# Patient Record
Sex: Female | Born: 2000 | Race: Black or African American | Hispanic: No | Marital: Single | Smoking: Never smoker
Health system: Southern US, Community
[De-identification: ages and names within clinical notes are randomized; demographics above are authoritative.]

## PROBLEM LIST (undated history)

## (undated) DIAGNOSIS — K648 Other hemorrhoids: Secondary | ICD-10-CM

## (undated) DIAGNOSIS — Z68.41 Body mass index (BMI) pediatric, greater than or equal to 95th percentile for age: Secondary | ICD-10-CM

## (undated) DIAGNOSIS — IMO0001 Reserved for inherently not codable concepts without codable children: Secondary | ICD-10-CM

## (undated) DIAGNOSIS — K5909 Other constipation: Secondary | ICD-10-CM

## (undated) DIAGNOSIS — R51 Headache: Secondary | ICD-10-CM

## (undated) DIAGNOSIS — H6690 Otitis media, unspecified, unspecified ear: Secondary | ICD-10-CM

## (undated) DIAGNOSIS — E669 Obesity, unspecified: Secondary | ICD-10-CM

## (undated) DIAGNOSIS — J40 Bronchitis, not specified as acute or chronic: Secondary | ICD-10-CM

## (undated) DIAGNOSIS — M25561 Pain in right knee: Secondary | ICD-10-CM

## (undated) DIAGNOSIS — IMO0002 Reserved for concepts with insufficient information to code with codable children: Secondary | ICD-10-CM

## (undated) DIAGNOSIS — R519 Headache, unspecified: Secondary | ICD-10-CM

## (undated) HISTORY — DX: Bronchitis, not specified as acute or chronic: J40

## (undated) HISTORY — DX: Other constipation: K59.09

## (undated) HISTORY — DX: Reserved for concepts with insufficient information to code with codable children: IMO0002

## (undated) HISTORY — DX: Obesity, unspecified: E66.9

## (undated) HISTORY — DX: Otitis media, unspecified, unspecified ear: H66.90

## (undated) HISTORY — DX: Headache: R51

## (undated) HISTORY — PX: NO PAST SURGERIES: SHX2092

## (undated) HISTORY — DX: Body mass index (bmi) pediatric, greater than or equal to 95th percentile for age: Z68.54

## (undated) HISTORY — DX: Reserved for inherently not codable concepts without codable children: IMO0001

## (undated) HISTORY — DX: Headache, unspecified: R51.9

## (undated) HISTORY — DX: Pain in right knee: M25.561

## (undated) HISTORY — DX: Other hemorrhoids: K64.8

---
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
## 2018-05-06 ENCOUNTER — Other Ambulatory Visit: Payer: Self-pay

## 2018-05-06 ENCOUNTER — Emergency Department (HOSPITAL_COMMUNITY): Payer: Medicaid Other

## 2018-05-06 ENCOUNTER — Emergency Department (HOSPITAL_COMMUNITY)
Admission: EM | Admit: 2018-05-06 | Discharge: 2018-05-06 | Disposition: A | Discharge status: Home / Self Care | Payer: Medicaid Other | Attending: Emergency Medicine | Admitting: Emergency Medicine

## 2018-05-06 DIAGNOSIS — R1013 Epigastric pain: Secondary | ICD-10-CM | POA: Diagnosis not present

## 2018-05-06 DIAGNOSIS — Z79899 Other long term (current) drug therapy: Secondary | ICD-10-CM | POA: Diagnosis not present

## 2018-05-06 LAB — I-STAT BETA HCG BLOOD, ED (MC, WL, AP ONLY): I-stat hCG, quantitative: <5 m[IU]/mL (ref <5)

## 2018-05-06 LAB — COMPREHENSIVE METABOLIC PANEL
ALT: 33 U/L (ref 0–44)
AST: 45 U/L — ABNORMAL HIGH (ref 15–41)
Albumin: 3.9 g/dL (ref 3.5–5.0)
Alkaline Phosphatase: 52 U/L (ref 38–126)
Anion gap: 11 (ref 5–15)
BUN: 11 mg/dL (ref 6–20)
CO2: 21 mmol/L — ABNORMAL LOW (ref 22–32)
Calcium: 9.6 mg/dL (ref 8.9–10.3)
Chloride: 107 mmol/L (ref 98–111)
Creatinine, Ser: 1 mg/dL (ref 0.44–1.00)
GFR calc Af Amer: >60 mL/min (ref >60)
GFR calc non Af Amer: >60 mL/min (ref >60)
Glucose, Bld: 123 mg/dL — ABNORMAL HIGH (ref 70–99)
Potassium: 3.7 mmol/L (ref 3.5–5.1)
Sodium: 139 mmol/L (ref 135–145)
Total Bilirubin: 0.5 mg/dL (ref 0.3–1.2)
Total Protein: 7.3 g/dL (ref 6.5–8.1)

## 2018-05-06 LAB — URINALYSIS, ROUTINE W REFLEX MICROSCOPIC
Bilirubin Urine: NEGATIVE
Glucose, UA: NEGATIVE mg/dL
Hgb urine dipstick: NEGATIVE
Ketones, ur: NEGATIVE mg/dL
LEUKOCYTE UA: NEGATIVE
NITRITE: NEGATIVE
Protein, ur: NEGATIVE mg/dL
Specific Gravity, Urine: >1.046 — ABNORMAL HIGH (ref 1.005–1.030)
pH: 6 (ref 5.0–8.0)

## 2018-05-06 LAB — CBC
HCT: 38.8 % (ref 36.0–46.0)
Hemoglobin: 12.5 g/dL (ref 12.0–15.0)
MCH: 30.1 pg (ref 26.0–34.0)
MCHC: 32.2 g/dL (ref 30.0–36.0)
MCV: 93.5 fL (ref 80.0–100.0)
Platelets: 363 10*3/uL (ref 150–400)
RBC: 4.15 MIL/uL (ref 3.87–5.11)
RDW: 12.3 % (ref 11.5–15.5)
WBC: 18.8 10*3/uL — AB (ref 4.0–10.5)
nRBC: 0 % (ref 0.0–0.2)

## 2018-05-06 LAB — LIPASE, BLOOD: LIPASE: 27 U/L (ref 11–51)

## 2018-05-06 MED ORDER — KETOROLAC TROMETHAMINE 30 MG/ML IJ SOLN
30.0000 mg | Freq: Once | INTRAMUSCULAR | Status: AC
  Administered 2018-05-06: 30 mg via INTRAVENOUS
  Filled 2018-05-06: qty 1

## 2018-05-06 MED ORDER — PANTOPRAZOLE SODIUM 20 MG PO TBEC: 20.0000 mg | DELAYED_RELEASE_TABLET | Freq: Every day | ORAL | 1 refills | Status: DC

## 2018-05-06 MED ORDER — SODIUM CHLORIDE 0.9% FLUSH
3.0000 mL | Freq: Once | INTRAVENOUS | Status: AC
  Administered 2018-05-06: 3 mL via INTRAVENOUS

## 2018-05-06 MED ORDER — SUCRALFATE 1 G PO TABS: 1.0000 g | ORAL_TABLET | Freq: Four times a day (QID) | ORAL | 0 refills | Status: AC

## 2018-05-06 MED ORDER — METOCLOPRAMIDE HCL 5 MG/ML IJ SOLN
10.0000 mg | INTRAMUSCULAR | Status: AC
  Administered 2018-05-06: 10 mg via INTRAVENOUS
  Filled 2018-05-06: qty 2

## 2018-05-06 MED ORDER — IOHEXOL 300 MG/ML  SOLN
100.0000 mL | Freq: Once | INTRAMUSCULAR | Status: AC | PRN
  Administered 2018-05-06: 100 mL via INTRAVENOUS

## 2018-05-06 MED ORDER — ONDANSETRON HCL 4 MG/2ML IJ SOLN
4.0000 mg | Freq: Once | INTRAMUSCULAR | Status: AC | PRN
  Administered 2018-05-06: 4 mg via INTRAVENOUS
  Filled 2018-05-06: qty 2

## 2018-05-06 MED ORDER — PROMETHAZINE HCL 25 MG PO TABS: 25.0000 mg | ORAL_TABLET | Freq: Four times a day (QID) | ORAL | 0 refills | Status: AC | PRN

## 2018-05-06 MED ORDER — PANTOPRAZOLE SODIUM 40 MG IV SOLR
40.0000 mg | Freq: Once | INTRAVENOUS | Status: AC
  Administered 2018-05-06: 40 mg via INTRAVENOUS
  Filled 2018-05-06: qty 40

## 2018-05-06 MED ORDER — SODIUM CHLORIDE 0.9 % IV BOLUS
1000.0000 mL | Freq: Once | INTRAVENOUS | Status: AC
  Administered 2018-05-06: 1000 mL via INTRAVENOUS

## 2018-05-06 MED ORDER — MORPHINE SULFATE (PF) 4 MG/ML IV SOLN
4.0000 mg | Freq: Once | INTRAVENOUS | Status: AC
  Administered 2018-05-06: 4 mg via INTRAVENOUS
  Filled 2018-05-06: qty 1

## 2018-05-06 NOTE — ED Notes (Addendum)
Patient transported to CT 

## 2018-05-06 NOTE — ED Notes (Signed)
Patient given ginger ale to drink 

## 2018-05-06 NOTE — ED Provider Notes (Signed)
[facility]   CSN: [id number] Arrival date & time: 05/06/18  0139    History   Chief Complaint Chief Complaint  Patient presents with  . Abdominal Pain    HPI Kaitlyn Blackburn is a 18 y.o. female.    18 year old female presents to the emergency department for evaluation of abdominal pain.  She has had similar pain in the past, most recently in July 2019.  Was evaluated at [facility] with negative ultrasound.  States that pain began 2 to 3 days ago.  It has been constant and gradually worsening, primarily in her epigastrium and nonradiating.  Patient took over-the-counter pain medication on day 1 of discomfort.  Has not taken any additional medicines.  She has had nausea with vomiting today.  Reports 3 episodes of nonbloody emesis.  Had a normal bowel movement earlier in the morning.  Denies history of abdominal surgeries.  Last menstrual period was 1 week ago.  The history is provided by the patient. No language interpreter was used.  Abdominal Pain    Past Medical History:  Diagnosis Date  . Bronchitis    patient was hospitalized for this  . Childhood obesity, BMI 95-100 percentile   . Chronic constipation   . Frontal headache   . Internal hemorrhoids without complication   . Knee pain, right   . Menarche   . Otitis media    patient was hospitalized for this    Patient Active Problem List   Diagnosis Date Noted  . Dysmenorrhea in adolescent 12/11/2014  . Pitted nails 10/12/2014  . Childhood obesity 10/12/2014  . Chronic constipation 10/12/2014  . Frontal headache 10/12/2014  . Internal hemorrhoids 10/12/2014  . Tinea versicolor 10/12/2014    Past Surgical History:  Procedure Laterality Date  . NO PAST SURGERIES       OB History   No obstetric history on file.      Home Medications    Prior to Admission medications   Medication Sig Start Date End Date Taking? Authorizing Provider    norgestimate-ethinyl estradiol (ORTHO-CYCLEN, 28,) 0.25-35 MG-MCG tablet Take 1 tablet by mouth daily. 12/13/17 12/13/18 Yes Pollak, Lavella HammockAdriana M, PA-C  pantoprazole (PROTONIX) 20 MG tablet Take 1 tablet (20 mg total) by mouth daily. 05/06/18   Antony MaduraHumes, Zeva Leber, PA-C  promethazine (PHENERGAN) 25 MG tablet Take 1 tablet (25 mg total) by mouth every 6 (six) hours as needed for nausea or vomiting. 05/06/18   Antony MaduraHumes, Efosa Treichler, PA-C  sucralfate (CARAFATE) 1 g tablet Take 1 tablet (1 g total) by mouth 4 (four) times daily. 05/06/18   Antony MaduraHumes, Maxwell Lemen, PA-C    Family History Family History  Problem Relation Age of Onset  . Healthy Mother   . Cancer Father        liver  . Diabetes Maternal Aunt   . Hypertension Maternal Grandmother   . Heart disease Maternal Grandmother   . Cancer Paternal Grandmother   . Cancer Paternal Grandfather     Social History Social History   Tobacco Use  . Smoking status: Never Smoker  Substance Use Topics  . Alcohol use: No    Alcohol/week: 0.0 standard drinks  . Drug use: No     Allergies   Patient has no known allergies.   Review of Systems Review of Systems  Gastrointestinal: Positive for abdominal pain.  Ten systems reviewed and are negative for acute change, except as noted in the HPI.    Physical Exam Updated Vital  Signs BP 132/66 (BP Location: Right Arm)   Pulse 70   Temp 98.2 F (36.8 C) (Oral)   Resp 16   Ht 5\' 8"  (1.727 m)   Wt 81.6 kg   SpO2 99%   BMI 27.37 kg/m   Physical Exam Vitals signs and nursing note reviewed.  Constitutional:      General: She is not in acute distress.    Appearance: She is well-developed.     Comments: Patient appears uncomfortable, nontoxic.  HENT:     Head: Normocephalic and atraumatic.  Eyes:     General: No scleral icterus.    Conjunctiva/sclera: Conjunctivae normal.  Neck:     Musculoskeletal: Normal range of motion.  Cardiovascular:     Rate and Rhythm: Normal rate and regular rhythm.     Pulses: Normal  pulses.  Pulmonary:     Breath sounds: No stridor. No wheezing, rhonchi or rales.     Comments: Lungs CTAB. Hyperventilating. Abdominal:     Palpations: Abdomen is soft. There is no mass.     Tenderness: There is abdominal tenderness (generalized). There is guarding (bilateral upper quadrants, voluntary).     Hernia: No hernia is present.     Comments: No peritoneal signs.  Musculoskeletal: Normal range of motion.  Skin:    General: Skin is warm and dry.     Coloration: Skin is not pale.     Findings: No erythema or rash.  Neurological:     Mental Status: She is alert and oriented to person, place, and time.     Coordination: Coordination normal.     Comments: Moving all extremities spontaneously.  Psychiatric:        Behavior: Behavior normal.      ED Treatments / Results  Labs (all labs ordered are listed, but only abnormal results are displayed) Labs Reviewed  COMPREHENSIVE METABOLIC PANEL - Abnormal; Notable for the following components:      Result Value   CO2 21 (*)    Glucose, Bld 123 (*)    AST 45 (*)    All other components within normal limits  CBC - Abnormal; Notable for the following components:   WBC 18.8 (*)    All other components within normal limits  URINALYSIS, ROUTINE W REFLEX MICROSCOPIC - Abnormal; Notable for the following components:   Specific Gravity, Urine >1.046 (*)    All other components within normal limits  LIPASE, BLOOD  I-STAT BETA HCG BLOOD, ED ([facility], [facility], [facility] ONLY)    EKG None  Radiology Ct Abdomen Pelvis W Contrast  Result Date: 05/06/2018 CLINICAL DATA:  Upper abdominal pain with nausea and vomiting EXAM: CT ABDOMEN AND PELVIS WITH CONTRAST TECHNIQUE: Multidetector CT imaging of the abdomen and pelvis was performed using the standard protocol following bolus administration of intravenous contrast. CONTRAST:  OMNIPAQUE IOHEXOL 300 MG/ML  SOLN COMPARISON:  None. FINDINGS: LOWER CHEST: There is no basilar pleural or apical  pericardial effusion. HEPATOBILIARY: The hepatic contours and density are normal. There is no intra- or extrahepatic biliary dilatation. The gallbladder is normal. PANCREAS: The pancreatic parenchymal contours are normal and there is no ductal dilatation. There is no peripancreatic fluid collection. SPLEEN: Normal. ADRENALS/URINARY TRACT: --Adrenal glands: Normal. --Right kidney/ureter: No hydronephrosis, nephroureterolithiasis, perinephric stranding or solid renal mass. --Left kidney/ureter: No hydronephrosis, nephroureterolithiasis, perinephric stranding or solid renal mass. --Urinary bladder: Normal for degree of distention STOMACH/BOWEL: --Stomach/Duodenum: There is no hiatal hernia or other gastric abnormality. The duodenal course and caliber are normal. --  Small bowel: No dilatation or inflammation. --Colon: No focal abnormality. --Appendix: Normal. VASCULAR/LYMPHATIC: Normal course and caliber of the major abdominal vessels. No abdominal or pelvic lymphadenopathy. REPRODUCTIVE: Normal uterus and ovaries. MUSCULOSKELETAL. There are bilateral pars interarticularis defects at L5 without associated anterolisthesis. OTHER: None. IMPRESSION: 1. No acute abdominal or pelvic abnormality. 2. 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 Electronically Signed   By: Deatra Robinson M.D.   On: 05/06/2018 02:59    Procedures Procedures (including critical care time)  Medications Ordered in ED Medications  sodium chloride flush (NS) 0.9 % injection 3 mL (3 mLs Intravenous Given 05/06/18 0157)  ondansetron (ZOFRAN) injection 4 mg (4 mg Intravenous Given 05/06/18 0202)  metoCLOPramide (REGLAN) injection 10 mg (10 mg Intravenous Given 05/06/18 0207)  sodium chloride 0.9 % bolus 1,000 mL (0 mLs Intravenous Stopped 05/06/18 0307)  morphine 4 MG/ML injection 4 mg (4 mg Intravenous Given 05/06/18 0207)  pantoprazole (PROTONIX) injection 40 mg (40 mg Intravenous Given 05/06/18 0258)  iohexol  (OMNIPAQUE) 300 MG/ML solution 100 mL (100 mLs Intravenous Contrast Given 05/06/18 0243)  ketorolac (TORADOL) 30 MG/ML injection 30 mg (30 mg Intravenous Given 05/06/18 0316)    4:23 AM Patient resting comfortably.  Abdominal pain has subsided.  Repeat abdominal exam without focal tenderness.  CT scan reviewed which shows no acute abnormality in the abdomen or pelvis.  Leukocytosis suspected secondary to stress of pain and frequent vomiting.  5:05 AM Continues to be pain-free.  Now tolerating ginger ale.   Initial Impression / Assessment and Plan / ED Course  I have reviewed the triage vital signs and the nursing notes.  Pertinent labs & imaging results that were available during my care of the patient were reviewed by me and considered in my medical decision making (see chart for details).        18 year old female presents to the emergency department for complaints of abdominal pain.  Symptoms have been present for the past 2 to 3 days, worsening tonight.  Had a generally tender abdomen on initial exam.  Mild voluntary guarding in bilateral upper quadrants.  Patient appears visibly uncomfortable, hyperventilating.  Noted to have leukocytosis of 18.8 with otherwise reassuring laboratory evaluation.  CT scan ordered for evaluation of abdominal discomfort.  Imaging shows no concerning process in the abdomen or pelvis.  She has had symptomatic improvement following 1 dose of morphine and Toradol.  Vomiting has subsided following Zofran and Reglan.  Patient now tolerating oral fluids.  She has been seen in the emergency department for complaints of similar pain 6 months ago.  Plan for referral to gastroenterology for further evaluation of symptoms.  Will continue on Carafate and Protonix.  Return precautions discussed and provided. Patient discharged in stable condition with no unaddressed concerns.   Final Clinical Impressions(s) / ED Diagnoses   Final diagnoses:  Epigastric pain    ED  Discharge Orders         Ordered    sucralfate (CARAFATE) 1 g tablet  4 times daily     05/06/18 0455    pantoprazole (PROTONIX) 20 MG tablet  Daily     05/06/18 0455    promethazine (PHENERGAN) 25 MG tablet  Every 6 hours PRN     05/06/18 0457           Antony Madura, PA-C 05/06/18 8416    Nira Conn, MD 05/06/18 [phone]

## 2018-05-06 NOTE — ED Triage Notes (Signed)
Per pt she has been having abdominal pain a few days and tonight the pain has gotten worse. Pt has threw up twice and is saying it is upper gastric pain. Crying and all over the bed.

## 2018-05-06 NOTE — Discharge Instructions (Signed)
We recommend the use of Carafate and Protonix as prescribed.  Call the office of [place] [facility] in the morning to schedule a follow-up appointment for further evaluation of your ongoing/recurrent abdominal pain.  Avoid spicy foods, excessive alcohol, coffee, citrus fruits as this may worsen your symptoms.  We also advised that you limit your use of anti-inflammatory medication such as Advil, Motrin, ibuprofen, Aleve.  Follow-up with your primary care doctor as needed.  You may return to the ED for new or concerning symptoms.

## 2018-11-29 ENCOUNTER — Other Ambulatory Visit: Payer: Self-pay

## 2018-11-29 DIAGNOSIS — Z20822 Contact with and (suspected) exposure to covid-19: Secondary | ICD-10-CM

## 2018-12-01 LAB — NOVEL CORONAVIRUS, NAA: SARS-CoV-2, NAA: NOT DETECTED

## 2018-12-27 ENCOUNTER — Emergency Department (HOSPITAL_COMMUNITY)
Admission: EM | Admit: 2018-12-27 | Discharge: 2018-12-27 | Disposition: A | Discharge status: Home / Self Care | Payer: Medicaid Other | Attending: Emergency Medicine | Admitting: Emergency Medicine

## 2018-12-27 ENCOUNTER — Other Ambulatory Visit: Payer: Self-pay

## 2018-12-27 ENCOUNTER — Encounter (HOSPITAL_COMMUNITY): Payer: Self-pay

## 2018-12-27 DIAGNOSIS — L0291 Cutaneous abscess, unspecified: Secondary | ICD-10-CM

## 2018-12-27 DIAGNOSIS — Z3A22 22 weeks gestation of pregnancy: Secondary | ICD-10-CM | POA: Insufficient documentation

## 2018-12-27 DIAGNOSIS — L02411 Cutaneous abscess of right axilla: Secondary | ICD-10-CM | POA: Insufficient documentation

## 2018-12-27 DIAGNOSIS — O219 Vomiting of pregnancy, unspecified: Secondary | ICD-10-CM | POA: Diagnosis present

## 2018-12-27 LAB — COMPREHENSIVE METABOLIC PANEL
ALT: 19 U/L (ref 0–44)
AST: 20 U/L (ref 15–41)
Albumin: 3.8 g/dL (ref 3.5–5.0)
Alkaline Phosphatase: 68 U/L (ref 38–126)
Anion gap: 10 (ref 5–15)
BUN: 6 mg/dL (ref 6–20)
CO2: 23 mmol/L (ref 22–32)
Calcium: 9.6 mg/dL (ref 8.9–10.3)
Chloride: 102 mmol/L (ref 98–111)
Creatinine, Ser: 0.69 mg/dL (ref 0.44–1.00)
GFR calc Af Amer: >60 mL/min (ref >60)
GFR calc non Af Amer: >60 mL/min (ref >60)
Glucose, Bld: 80 mg/dL (ref 70–99)
Potassium: 3.4 mmol/L — ABNORMAL LOW (ref 3.5–5.1)
Sodium: 135 mmol/L (ref 135–145)
Total Bilirubin: 0.9 mg/dL (ref 0.3–1.2)
Total Protein: 7.5 g/dL (ref 6.5–8.1)

## 2018-12-27 LAB — CBC WITH DIFFERENTIAL/PLATELET
Abs Immature Granulocytes: 0.07 10*3/uL (ref 0.00–0.07)
Basophils Absolute: 0 10*3/uL (ref 0.0–0.1)
Basophils Relative: 0 %
Eosinophils Absolute: 0.1 10*3/uL (ref 0.0–0.5)
Eosinophils Relative: 0 %
HCT: 38.8 % (ref 36.0–46.0)
Hemoglobin: 13 g/dL (ref 12.0–15.0)
Immature Granulocytes: 1 %
Lymphocytes Relative: 11 %
Lymphs Abs: 1.6 10*3/uL (ref 0.7–4.0)
MCH: 32.1 pg (ref 26.0–34.0)
MCHC: 33.5 g/dL (ref 30.0–36.0)
MCV: 95.8 fL (ref 80.0–100.0)
Monocytes Absolute: 0.8 10*3/uL (ref 0.1–1.0)
Monocytes Relative: 6 %
Neutro Abs: 11.2 10*3/uL — ABNORMAL HIGH (ref 1.7–7.7)
Neutrophils Relative %: 82 %
Platelets: 283 10*3/uL (ref 150–400)
RBC: 4.05 MIL/uL (ref 3.87–5.11)
RDW: 13.1 % (ref 11.5–15.5)
WBC: 13.7 10*3/uL — ABNORMAL HIGH (ref 4.0–10.5)
nRBC: 0 % (ref 0.0–0.2)

## 2018-12-27 LAB — LIPASE, BLOOD: Lipase: 24 U/L (ref 11–51)

## 2018-12-27 MED ORDER — LIDOCAINE-EPINEPHRINE (PF) 2 %-1:200000 IJ SOLN
10.0000 mL | Freq: Once | INTRAMUSCULAR | Status: AC
  Administered 2018-12-27: 10 mL
  Filled 2018-12-27: qty 10

## 2018-12-27 MED ORDER — LIDOCAINE-EPINEPHRINE (PF) 2 %-1:200000 IJ SOLN: 10.0000 mL | Freq: Once | INTRAMUSCULAR | Status: DC

## 2018-12-27 MED ORDER — SODIUM CHLORIDE 0.9 % IV BOLUS
1000.0000 mL | Freq: Once | INTRAVENOUS | Status: AC
  Administered 2018-12-27: 16:00:00 1000 mL via INTRAVENOUS

## 2018-12-27 NOTE — ED Provider Notes (Signed)
[facility]-EMERGENCY DEPT Provider Note   CSN: [id number] Arrival date & time: 12/27/18  1352     History   Chief Complaint Chief Complaint  Patient presents with  . Abscess  . Emesis During Pregnancy    HPI Kaitlyn Blackburn is a 18 y.o. female G1P0 lmp 09/22/2018 who presents today for evaluation of an abscess.  She reports that it started about a month ago in her right axilla.  She denies any fevers.  She was seen by her PCP and started on clindamycin.  She reports that she has benn taking the medicaion.    She reports that she has had severe nausea and vomiting for the duration of her pregnancy.  She reports that in the past 24 hours she has vomited approximately 3 times.  She thinks that she has lost a significant amount of weight, approximately 40 pounds during this time.  She has not received any prenatal care.  She has not been taking any prenatal vitamins.  She has an appointment to follow-up with her primary care doctor on Thursday for possible I&D however states that she was unable to wait.  She has not had any imaging, lab testing or other pregnancy related care. She reports that her last tetanus shot was within the past 10 years.  She states that her nausea and vomiting has not changed since the development of her abscess.  She reports that she does have an OB/GYN however has not been physically seen yet.       HPI  Past Medical History:  Diagnosis Date  . Bronchitis    patient was hospitalized for this  . Childhood obesity, BMI 95-100 percentile   . Chronic constipation   . Frontal headache   . Internal hemorrhoids without complication   . Knee pain, right   . Menarche   . Otitis media    patient was hospitalized for this    Patient Active Problem List   Diagnosis Date Noted  . Dysmenorrhea in adolescent 12/11/2014  . Pitted nails 10/12/2014  . Childhood obesity 10/12/2014  . Chronic constipation 10/12/2014  . Frontal headache  10/12/2014  . Internal hemorrhoids 10/12/2014  . Tinea versicolor 10/12/2014    Past Surgical History:  Procedure Laterality Date  . NO PAST SURGERIES       OB History    Gravida  1   Para      Term      Preterm      AB      Living        SAB      TAB      Ectopic      Multiple      Live Births               Home Medications    Prior to Admission medications   Medication Sig Start Date End Date Taking? Authorizing Provider  Glycerin-Hypromellose-PEG 400 ([facility] RELIEF OP) Apply 1-2 drops to eye daily as needed (itchy eyes).   Yes [provider]  pantoprazole (PROTONIX) 20 MG tablet Take 1 tablet (20 mg total) by mouth daily. 05/06/18  Yes Antony MaduraHumes, Kelly, PA-C  norgestimate-ethinyl estradiol (ORTHO-CYCLEN, 28,) 0.25-35 MG-MCG tablet Take 1 tablet by mouth daily. 12/13/17 12/13/18  Trey SailorsPollak, Adriana M, PA-C  promethazine (PHENERGAN) 25 MG tablet Take 1 tablet (25 mg total) by mouth every 6 (six) hours as needed for nausea or vomiting. Patient not taking: Reported on 12/27/2018 05/06/18  Antony Madura, PA-C  sucralfate (CARAFATE) 1 g tablet Take 1 tablet (1 g total) by mouth 4 (four) times daily. Patient not taking: Reported on 12/27/2018 05/06/18   Antony Madura, PA-C    Family History Family History  Problem Relation Age of Onset  . Healthy Mother   . Cancer Father        liver  . Diabetes Maternal Aunt   . Hypertension Maternal Grandmother   . Heart disease Maternal Grandmother   . Cancer Paternal Grandmother   . Cancer Paternal Grandfather     Social History Social History   Tobacco Use  . Smoking status: Never Smoker  Substance Use Topics  . Alcohol use: No    Alcohol/week: 0.0 standard drinks  . Drug use: No     Allergies   Patient has no known allergies.   Review of Systems Review of Systems  Constitutional: Negative for chills and fever.  HENT: Negative for congestion.   Respiratory: Negative for cough and shortness of  breath.   Cardiovascular: Negative for chest pain.  Gastrointestinal: Positive for nausea and vomiting. Negative for abdominal pain.  Genitourinary: Negative for dysuria, flank pain, frequency, pelvic pain, vaginal bleeding and vaginal discharge.  Musculoskeletal: Negative for back pain and neck pain.  Skin: Negative for color change and pallor.  All other systems reviewed and are negative.    Physical Exam Updated Vital Signs BP 123/74   Pulse 63   Temp 98.7 F (37.1 C) (Oral)   Resp 18   Ht  (1.727 m)   Wt 69.6 kg   SpO2 100%   BMI 23.32 kg/m   Physical Exam Vitals signs and nursing note reviewed.  Constitutional:      General: She is not in acute distress.    Appearance: She is well-developed. She is not diaphoretic.  HENT:     Head: Normocephalic and atraumatic.  Eyes:     General: No scleral icterus.       Right eye: No discharge.        Left eye: No discharge.     Conjunctiva/sclera: Conjunctivae normal.  Neck:     Musculoskeletal: Normal range of motion and neck supple.  Cardiovascular:     Rate and Rhythm: Normal rate and regular rhythm.     Pulses: Normal pulses.     Heart sounds: Normal heart sounds.  Pulmonary:     Effort: Pulmonary effort is normal. No respiratory distress.     Breath sounds: No stridor.  Abdominal:     General: Abdomen is flat. Bowel sounds are normal. There is no distension.     Tenderness: There is no abdominal tenderness.     Comments: Uterus not palpable above the pelvic brim.  Musculoskeletal:        General: No deformity.  Skin:    General: Skin is warm and dry.     Comments: Please see clinical image.  There is a large egg sized swelling in the right axilla with surrounding induration.  This area is fluctuant to palpation and tender.  There is no drainage.  Neurological:     General: No focal deficit present.     Mental Status: She is alert.     Cranial Nerves: No cranial nerve deficit.     Motor: No abnormal muscle  tone.  Psychiatric:        Mood and Affect: Mood normal.        Behavior: Behavior normal.  ED Treatments / Results  Labs (all labs ordered are listed, but only abnormal results are displayed) Labs Reviewed  COMPREHENSIVE METABOLIC PANEL - Abnormal; Notable for the following components:      Result Value   Potassium 3.4 (*)    All other components within normal limits  CBC WITH DIFFERENTIAL/PLATELET - Abnormal; Notable for the following components:   WBC 13.7 (*)    Neutro Abs 11.2 (*)    All other components within normal limits  AEROBIC CULTURE (SUPERFICIAL SPECIMEN)  LIPASE, BLOOD    EKG None  Radiology No results found.  Procedures .Marland KitchenIncision and Drainage  Date/Time: 12/28/2018 12:59 AM Performed by: Lorin Glass, PA-C Authorized by: Lorin Glass, PA-C   Consent:    Consent obtained:  Verbal   Consent given by:  Patient   Risks discussed:  Bleeding, incomplete drainage, pain and infection (Damage to other structures, need for additional procedures, adverse or allergic reaction to medications.  Possible fetal adverse reactions to medications.)   Alternatives discussed:  No treatment, alternative treatment and referral Location:    Type:  Abscess   Size:  4cm by 3cm   Location: Right axilla. Pre-procedure details:    Skin preparation:  Chloraprep Anesthesia (see MAR for exact dosages):    Anesthesia method:  Local infiltration   Local anesthetic:  Lidocaine 2% WITH epi Procedure type:    Complexity:  Complex Procedure details:    Incision types:  Stab incision   Incision depth:  Subcutaneous   Scalpel blade:  11   Wound management:  Probed and deloculated and irrigated with saline   Drainage:  Purulent   Drainage amount:  Copious   Wound treatment:  Wound left open   Packing materials:  1/4 in gauze   Amount 1/4":  1 continuous strip Post-procedure details:    Patient tolerance of procedure:  Tolerated well, no immediate  complications   (including critical care time)  Medications Ordered in ED Medications  sodium chloride 0.9 % bolus 1,000 mL (0 mLs Intravenous Stopped 12/27/18 1751)  lidocaine-EPINEPHrine (XYLOCAINE W/EPI) 2 %-1:200000 (PF) injection 10 mL (10 mLs Infiltration Given by Other 12/27/18 1623)     Initial Impression / Assessment and Plan / ED Course  I have reviewed the triage vital signs and the nursing notes.  Pertinent labs & imaging results that were available during my care of the patient were reviewed by me and considered in my medical decision making (see chart for details).        Patient presents today for evaluation of a skin abscess.  She was previously seen by her primary care doctor and started on clindamycin.  She reports that it has become more swollen and painful.  Physical exam shows a abscess in the right axilla.  After she gave informed verbal consent I&D was performed which was tolerated well.  Gauze packing was placed.  She has a follow-up appointment on Thursday with her primary care doctor for a wound recheck.  She is instructed to continue taking her clindamycin.  Chart review shows that she has a positive pregnancy test in care everywhere.  She has not had any prenatal care thus far.  She does not have any abdominal pains, abdominal or vaginal bleeding or pregnancy related concerns today other than nausea and vomiting.  She reports that she has lost weight during her pregnancy from this.  She has not tried any interventions thus far.  She is given general information about pregnancy, we discussed  the importance of taking prenatal vitamins which she has not been doing, and about morning sickness.  Given her reported multiple vomiting labs are obtained and reviewed.  She does have a slight leukocytosis at 13.7, however she is not tachycardic and afebrile therefore I suspect that this is related to her superficial skin abscess rather than a spread of infection.  Potassium is  slightly low at 3.4 which I suspect is related to her vomiting.  She is given information on using Unisom and vitamin B6 for nausea along with eating small frequent meals.  She is given follow-up information for OB/GYN.  Return precautions were discussed with patient who states their understanding.  At the time of discharge patient denied any unaddressed complaints or concerns.  Patient is agreeable for discharge home.    Final Clinical Impressions(s) / ED Diagnoses   Final diagnoses:  Nausea and vomiting during pregnancy prior to [redacted] weeks gestation  Abscess    ED Discharge Orders    None       Norman Clay 12/28/18 Lerry Paterson, MD 12/28/18 2022

## 2018-12-27 NOTE — Discharge Instructions (Addendum)
Please take Tylenol (acetaminophen) to relieve your pain.  You may take tylenol, up to 1,000 mg (two extra strength pills).  Do not take more than 3,000 mg tylenol in a 24 hour period.  Please check all medication labels as many medications such as pain and cold medications may contain tylenol. Please do not drink alcohol while taking this medication.   Keep taking your antibiotics.   For morning sickness and nausea and vomiting in pregnancy you may take Diclegis.  This is an expensive prescription.  The active ingredients are the same as taking unisom or a similar brand (Doxylamine succinate) and vitamin B6 (Pyridoxine hydrochloride).  Please make sure the active ingredient matches the same name as there are many types of unisom and generic medications.  You may take 12.5 of unisom (or generic) every 4-6 hours, if needed you can take up to 25mg .  Please do not take more than 75mg  in 24 hours.   For the vitamin B6 (pyridoxine) you may take 10mg  up to twice a day.   START TAKING A PRENATAL VITAMIN!

## 2018-12-27 NOTE — ED Triage Notes (Signed)
Pt presents with c/o abscess under her right arm. Pt reports she hasn't had any treatment for it but her PCP told her they needed to wait for it to get "softer". Pt also c/o emesis during pregnancy. Pt is approx 3 months pregnant.

## 2018-12-30 LAB — AEROBIC CULTURE W GRAM STAIN (SUPERFICIAL SPECIMEN): Special Requests: NORMAL

## 2018-12-30 LAB — AEROBIC CULTURE? (SUPERFICIAL SPECIMEN)

## 2019-01-01 ENCOUNTER — Telehealth: Payer: Self-pay | Admitting: Emergency Medicine

## 2019-01-01 NOTE — Telephone Encounter (Signed)
Post ED Visit - Positive Culture Follow-up: Unsuccessful Patient Follow-up  Culture assessed and recommendations reviewed by:  []  Elenor Quinones, Pharm.D. []  Heide Guile, Pharm.D., BCPS AQ-ID []  Parks Neptune, Pharm.D., BCPS []  Alycia Rossetti, Pharm.D., BCPS []  [place], Pharm.D., BCPS, AAHIVP []  Legrand Como, Pharm.D., BCPS, AAHIVP []  Wynell Balloon, PharmD [x]  Reuel Boom, PharmD, BCPS  Positive aerobic culture  []  Patient discharged without antimicrobial prescription and treatment is now indicated []  Organism is resistant to prescribed ED discharge antimicrobial []  Patient with positive blood cultures Prescribed clindamycin by PMD, sensitive to same, contact patient to ensure follow-up with PCP.  Unable to contact @ phone number provided, left voicemail, letter will be sent to address on file  Milus Mallick 01/01/2019, 5:41 PM

## 2019-01-10 ENCOUNTER — Ambulatory Visit (INDEPENDENT_AMBULATORY_CARE_PROVIDER_SITE_OTHER): Payer: Medicaid Other

## 2019-01-10 DIAGNOSIS — Z3401 Encounter for supervision of normal first pregnancy, first trimester: Secondary | ICD-10-CM

## 2019-01-10 MED ORDER — BLOOD PRESSURE MONITOR KIT: PACK | 0 refills | Status: AC

## 2019-01-10 MED ORDER — BLOOD PRESSURE MONITOR/M CUFF MISC: 0 refills | Status: AC

## 2019-01-10 NOTE — Assessment & Plan Note (Signed)
..   Nursing Staff Provider  Office Location  [facility] Dating    Language  English Anatomy US    Flu Vaccine  Declined Genetic Screen  NIPS:   AFP:   First Screen:  Quad:    TDaP vaccine    Hgb A1C or  GTT Early  Third trimester   Rhogam     LAB RESULTS   Feeding Plan Breast/Bottle Blood Type     Contraception Nexplanon Antibody    Circumcision Yes if BOY Rubella    Pediatrician  Unsure RPR     Support Person [facility] HBsAg     Prenatal Classes  HIV    BTL Consent  GBS  (For PCN allergy, check sensitivities)   VBAC Consent  Pap     Hgb Electro    BP Cuff 01/10/19 CF     SMA     Waterbirth  [ ]  Class [ ]  Consent [ ]  CNM visit

## 2019-01-10 NOTE — Progress Notes (Signed)
  Virtual Visit via Telephone Note  I connected with Kaitlyn Blackburn on 01/10/19 at  1:45 PM EDT by telephone and verified that I am speaking with the correct person using two identifiers.  Location: [facility]  Patient: Kaitlyn Blackburn   I discussed the limitations, risks, security and privacy concerns of performing an evaluation and management service by telephone and the availability of in person appointments. I also discussed with the patient that there may be a patient responsible charge related to this service. The patient expressed understanding and agreed to proceed.   History of Present Illness: PRENATAL INTAKE SUMMARY  Kaitlyn Blackburn presents today New OB Nurse Interview.  OB History    Gravida  1   Para      Term      Preterm      AB      Living        SAB      TAB      Ectopic      Multiple      Live Births             I have reviewed the patient's medical, obstetrical, social, and family histories, medications, and available lab results.  SUBJECTIVE She has no unusual complaints   Observations/Objective: Initial nurse interview for history/labs (New OB)  EDD: 06/29/19 GA: 15w 5d GP G1 P0  GENERAL APPEARANCE: alert, well appearing  Assessment and Plan: Normal pregnancy Prenatal care OB Pnl/HIV  OB Urine Culture GC/CT HgbEval SMA CF A1C Glucose  If CHTN - P/C Ratio and CMP   Follow Up Instructions:   I discussed the assessment and treatment plan with the patient. The patient was provided an opportunity to ask questions and all were answered. The patient agreed with the plan and demonstrated an understanding of the instructions.   The patient was advised to call back or seek an in-person evaluation if the symptoms worsen or if the condition fails to improve as anticipated.  I provided 10 minutes of non-face-to-face time during this encounter.   Delrae Alfred, CMA

## 2019-01-10 NOTE — Addendum Note (Signed)
Addended by: Delrae Alfred on: 01/10/2019 03:06 PM   Modules accepted: Orders

## 2019-05-31 IMAGING — CT CT ABD-PELV W/ CM
2 of 4 series · 16 of 46 positions shown, 18 images · IV contrast (omnipaque)
Comparison: None.

CLINICAL DATA: Upper abdominal pain with nausea and vomiting

EXAM:
CT ABDOMEN AND PELVIS WITH CONTRAST
TECHNIQUE: Multidetector CT imaging of the abdomen and pelvis was performed
using the standard protocol following bolus administration of
intravenous contrast.
CONTRAST:  100mL OMNIPAQUE IOHEXOL 300 MG/ML  SOLN

[Series 3: a/p w/ 5mm · axial · 0.95mm/px · z∈[+612,+1082]mm · 13 of 104 slices shown, 15 images]
[im 5/104  soft-tissue]
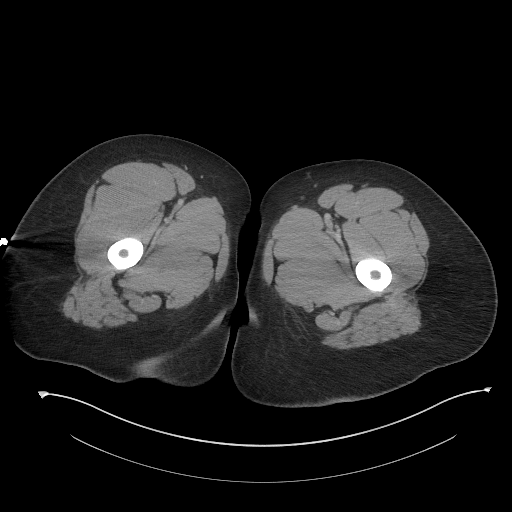
[im 5/104  bone]
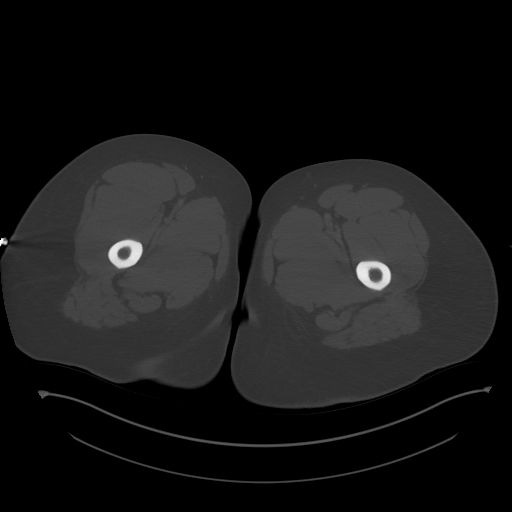
[im 13/104  soft-tissue]
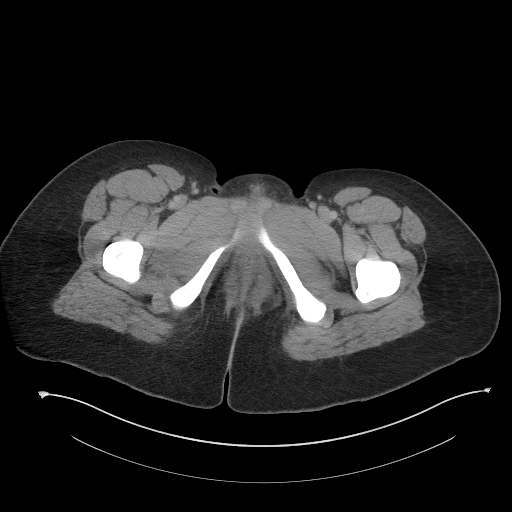
[im 22/104  soft-tissue]
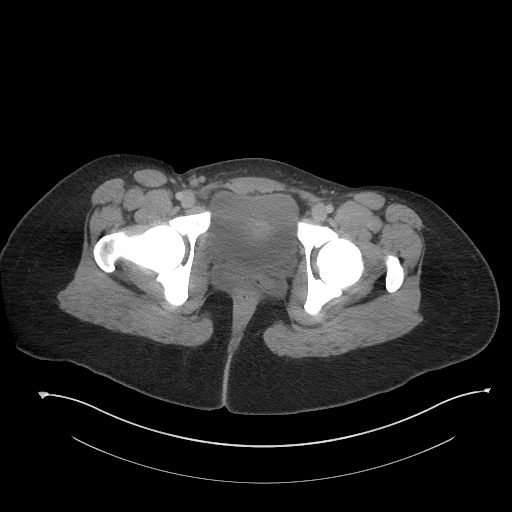
[im 31/104  soft-tissue]
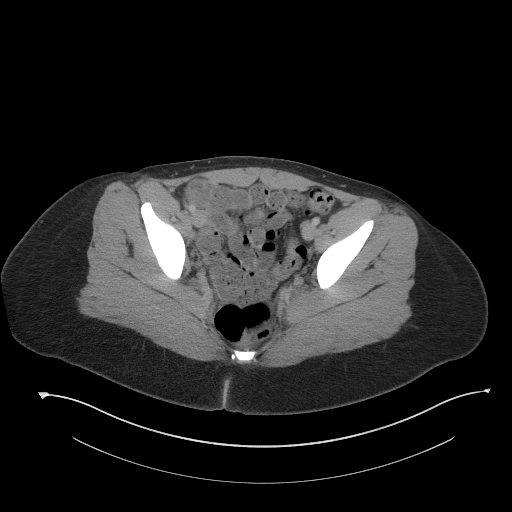
[im 35/104  soft-tissue]
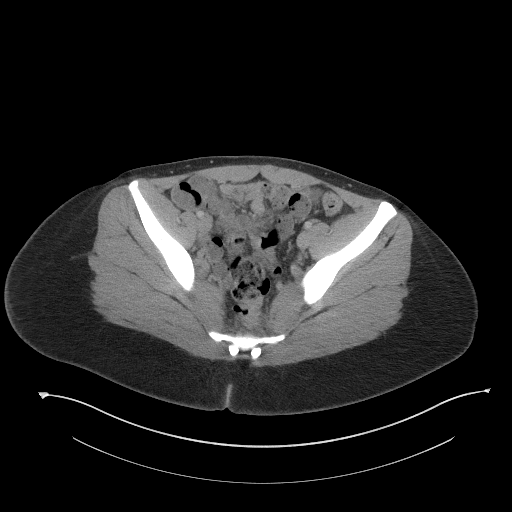
[im 43/104  soft-tissue]
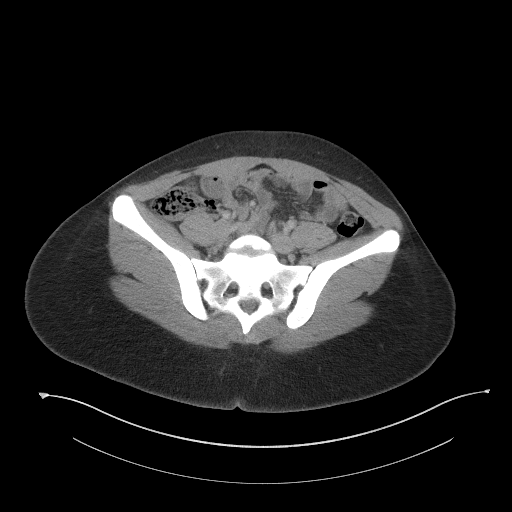
[im 52/104  soft-tissue]
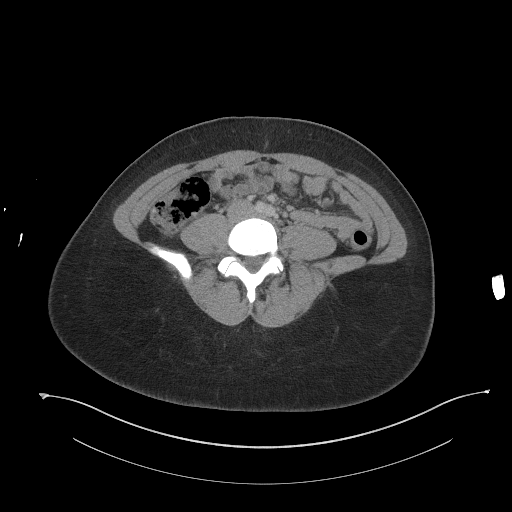
[im 61/104  soft-tissue]
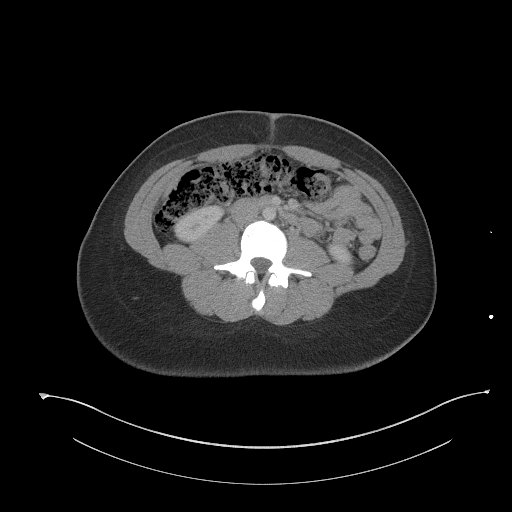
[im 69/104  soft-tissue]
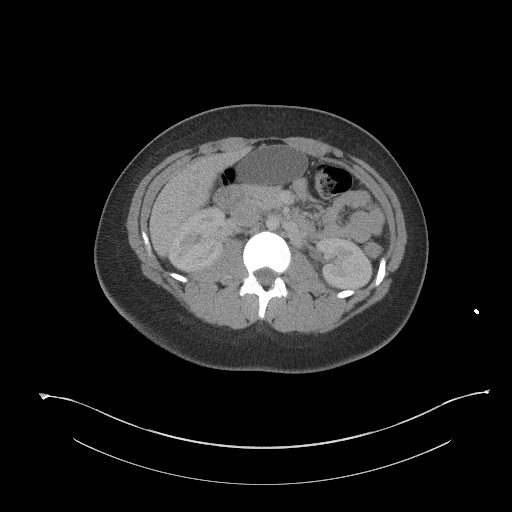
[im 69/104  bone]
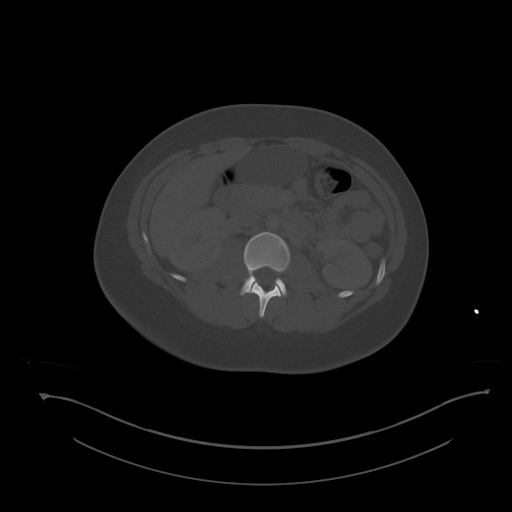
[im 73/104  soft-tissue]
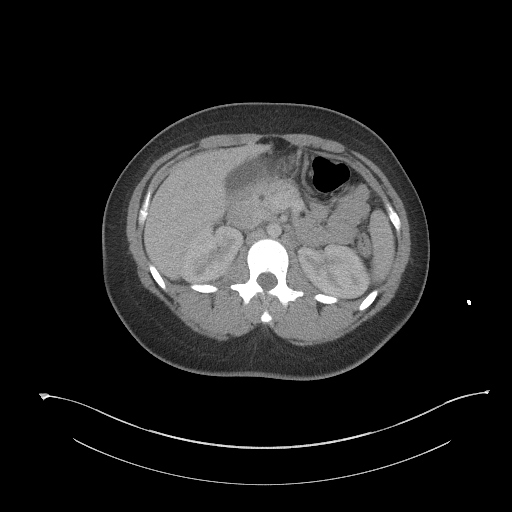
[im 82/104  soft-tissue]
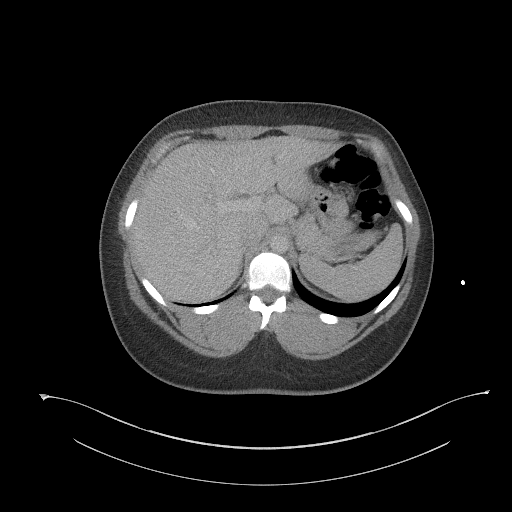
[im 91/104  soft-tissue]
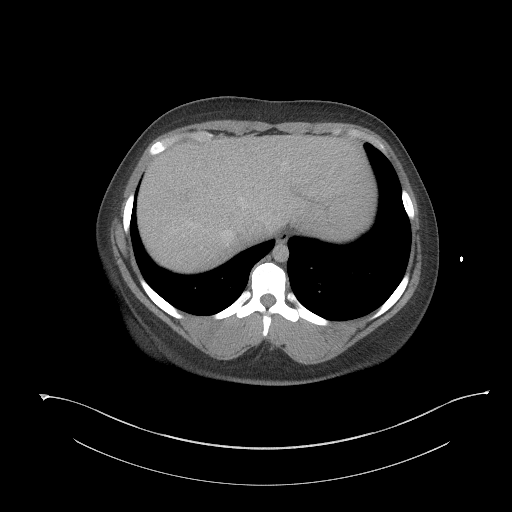
[im 99/104  soft-tissue]
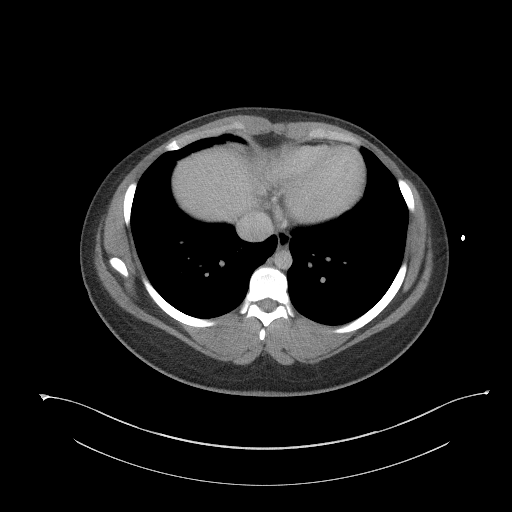

[Series 6: a/p w/ cor · coronal · 0.94mm/px · 3 of 151 slices shown]
[im 51/151  soft-tissue]
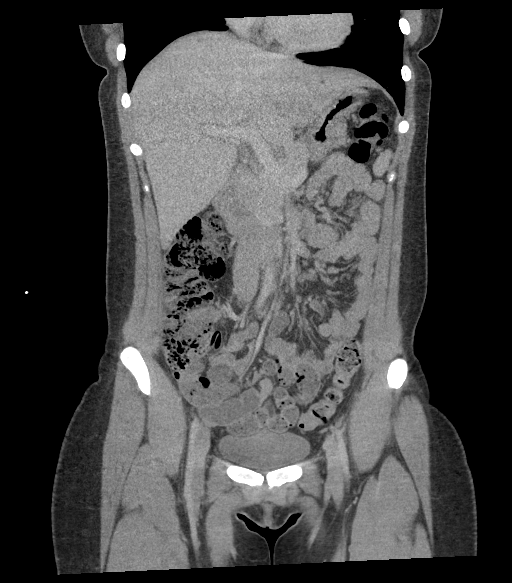
[im 67/151  soft-tissue]
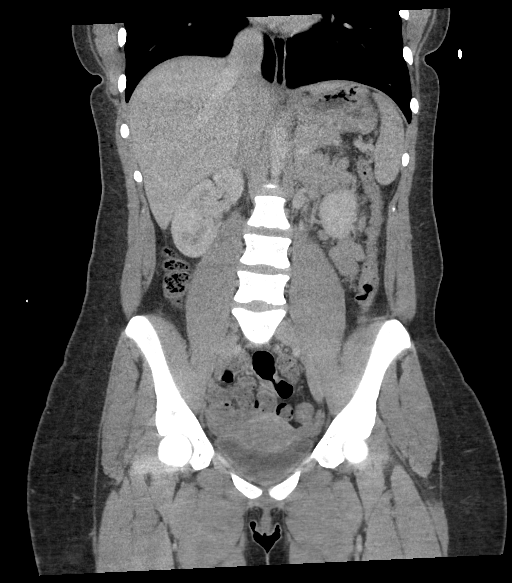
[im 84/151  soft-tissue]
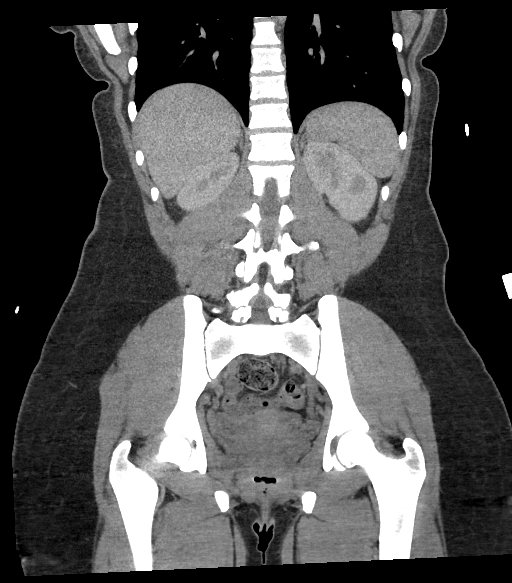

[16 of 46 positions shown; findings below may reference images not displayed]

FINDINGS: LOWER CHEST: There is no basilar pleural or apical pericardial
effusion.

HEPATOBILIARY: The hepatic contours and density are normal. There is
no intra- or extrahepatic biliary dilatation. The gallbladder is
normal.

PANCREAS: The pancreatic parenchymal contours are normal and there
is no ductal dilatation. There is no peripancreatic fluid
collection.

SPLEEN: Normal.

ADRENALS/URINARY TRACT:

--Adrenal glands: Normal.

--Right kidney/ureter: No hydronephrosis, nephroureterolithiasis,
perinephric stranding or solid renal mass.

--Left kidney/ureter: No hydronephrosis, nephroureterolithiasis,
perinephric stranding or solid renal mass.

--Urinary bladder: Normal for degree of distention

STOMACH/BOWEL:

--Stomach/Duodenum: There is no hiatal hernia or other gastric
abnormality. The duodenal course and caliber are normal.

--Small bowel: No dilatation or inflammation.

--Colon: No focal abnormality.

--Appendix: Normal.

VASCULAR/LYMPHATIC: Normal course and caliber of the major abdominal
vessels. No abdominal or pelvic lymphadenopathy.

REPRODUCTIVE: Normal uterus and ovaries.

MUSCULOSKELETAL. There are bilateral pars interarticularis defects
at L5 without associated anterolisthesis.

OTHER: None.
IMPRESSION: 1. No acute abdominal or pelvic abnormality.
2. Bilateral L5 pars interarticularis defects without associated
anterolisthesis.
